# Patient Record
Sex: Female | Born: 1970 | Race: Black or African American | Hispanic: No | Marital: Single | Smoking: Never smoker
Health system: Southern US, Community
[De-identification: ages and names within clinical notes are randomized; demographics above are authoritative.]

## PROBLEM LIST (undated history)

## (undated) DIAGNOSIS — B009 Herpesviral infection, unspecified: Secondary | ICD-10-CM

## (undated) DIAGNOSIS — I10 Essential (primary) hypertension: Secondary | ICD-10-CM

---

## 2006-12-03 ENCOUNTER — Encounter: Admission: RE | Admit: 2006-12-03 | Discharge: 2006-12-03 | Payer: Self-pay | Admitting: Obstetrics and Gynecology

## 2007-03-28 ENCOUNTER — Emergency Department (HOSPITAL_COMMUNITY): Admission: EM | Admit: 2007-03-28 | Discharge: 2007-03-28 | Payer: Self-pay | Admitting: Emergency Medicine

## 2011-07-28 LAB — URINALYSIS, ROUTINE W REFLEX MICROSCOPIC
Glucose, UA: NEGATIVE
Nitrite: NEGATIVE
Specific Gravity, Urine: 1.013

## 2011-07-28 LAB — URINE MICROSCOPIC-ADD ON

## 2011-07-28 LAB — URINE CULTURE

## 2016-02-11 ENCOUNTER — Emergency Department (HOSPITAL_COMMUNITY)
Admission: EM | Admit: 2016-02-11 | Discharge: 2016-02-12 | Disposition: A | Discharge status: Home / Self Care | Payer: BLUE CROSS/BLUE SHIELD | Attending: Emergency Medicine | Admitting: Emergency Medicine

## 2016-02-11 ENCOUNTER — Emergency Department (HOSPITAL_COMMUNITY): Payer: BLUE CROSS/BLUE SHIELD

## 2016-02-11 ENCOUNTER — Encounter (HOSPITAL_COMMUNITY): Payer: Self-pay | Admitting: Emergency Medicine

## 2016-02-11 DIAGNOSIS — R1010 Upper abdominal pain, unspecified: Secondary | ICD-10-CM | POA: Diagnosis present

## 2016-02-11 DIAGNOSIS — Z3202 Encounter for pregnancy test, result negative: Secondary | ICD-10-CM | POA: Diagnosis not present

## 2016-02-11 DIAGNOSIS — I10 Essential (primary) hypertension: Secondary | ICD-10-CM | POA: Insufficient documentation

## 2016-02-11 DIAGNOSIS — R0602 Shortness of breath: Secondary | ICD-10-CM | POA: Insufficient documentation

## 2016-02-11 DIAGNOSIS — N390 Urinary tract infection, site not specified: Secondary | ICD-10-CM

## 2016-02-11 DIAGNOSIS — R05 Cough: Secondary | ICD-10-CM | POA: Diagnosis not present

## 2016-02-11 DIAGNOSIS — R7401 Elevation of levels of liver transaminase levels: Secondary | ICD-10-CM

## 2016-02-11 DIAGNOSIS — R197 Diarrhea, unspecified: Secondary | ICD-10-CM | POA: Diagnosis not present

## 2016-02-11 DIAGNOSIS — R079 Chest pain, unspecified: Secondary | ICD-10-CM | POA: Insufficient documentation

## 2016-02-11 DIAGNOSIS — R740 Nonspecific elevation of levels of transaminase and lactic acid dehydrogenase [LDH]: Secondary | ICD-10-CM | POA: Insufficient documentation

## 2016-02-11 DIAGNOSIS — R1011 Right upper quadrant pain: Secondary | ICD-10-CM

## 2016-02-11 DIAGNOSIS — Z8619 Personal history of other infectious and parasitic diseases: Secondary | ICD-10-CM | POA: Insufficient documentation

## 2016-02-11 DIAGNOSIS — Z79899 Other long term (current) drug therapy: Secondary | ICD-10-CM | POA: Insufficient documentation

## 2016-02-11 HISTORY — DX: Herpesviral infection, unspecified: B00.9

## 2016-02-11 HISTORY — DX: Essential (primary) hypertension: I10

## 2016-02-11 LAB — BASIC METABOLIC PANEL
ANION GAP: 10 (ref 5–15)
BUN: 12 mg/dL (ref 6–20)
CALCIUM: 9.3 mg/dL (ref 8.9–10.3)
CO2: 22 mmol/L (ref 22–32)
Chloride: 106 mmol/L (ref 101–111)
Creatinine, Ser: 0.99 mg/dL (ref 0.44–1.00)
GLUCOSE: 105 mg/dL — AB (ref 65–99)
Potassium: 3.3 mmol/L — ABNORMAL LOW (ref 3.5–5.1)
SODIUM: 138 mmol/L (ref 135–145)

## 2016-02-11 LAB — URINALYSIS, ROUTINE W REFLEX MICROSCOPIC
Bilirubin Urine: NEGATIVE
GLUCOSE, UA: NEGATIVE mg/dL
KETONES UR: NEGATIVE mg/dL
Nitrite: NEGATIVE
PROTEIN: NEGATIVE mg/dL
Specific Gravity, Urine: 1.012 (ref 1.005–1.030)
pH: 5 (ref 5.0–8.0)

## 2016-02-11 LAB — D-DIMER, QUANTITATIVE (NOT AT ARMC): D DIMER QUANT: 0.76 ug{FEU}/mL — AB (ref 0.00–0.50)

## 2016-02-11 LAB — HEPATIC FUNCTION PANEL
ALBUMIN: 4 g/dL (ref 3.5–5.0)
ALT: 84 U/L — ABNORMAL HIGH (ref 14–54)
AST: 178 U/L — ABNORMAL HIGH (ref 15–41)
Alkaline Phosphatase: 88 U/L (ref 38–126)
BILIRUBIN DIRECT: 0.2 mg/dL (ref 0.1–0.5)
BILIRUBIN TOTAL: 1 mg/dL (ref 0.3–1.2)
Indirect Bilirubin: 0.8 mg/dL (ref 0.3–0.9)
Total Protein: 7.3 g/dL (ref 6.5–8.1)

## 2016-02-11 LAB — CBC
HCT: 38.2 % (ref 36.0–46.0)
HEMOGLOBIN: 13 g/dL (ref 12.0–15.0)
MCH: 30.3 pg (ref 26.0–34.0)
MCHC: 34 g/dL (ref 30.0–36.0)
MCV: 89 fL (ref 78.0–100.0)
Platelets: 291 10*3/uL (ref 150–400)
RBC: 4.29 MIL/uL (ref 3.87–5.11)
RDW: 12.8 % (ref 11.5–15.5)
WBC: 11.4 10*3/uL — AB (ref 4.0–10.5)

## 2016-02-11 LAB — I-STAT TROPONIN, ED: TROPONIN I, POC: 0 ng/mL (ref 0.00–0.08)

## 2016-02-11 LAB — URINE MICROSCOPIC-ADD ON

## 2016-02-11 LAB — PREGNANCY, URINE: Preg Test, Ur: NEGATIVE

## 2016-02-11 LAB — LIPASE, BLOOD: LIPASE: 25 U/L (ref 11–51)

## 2016-02-11 MED ORDER — SODIUM CHLORIDE 0.9 % IV BOLUS (SEPSIS)
1000.0000 mL | Freq: Once | INTRAVENOUS | Status: AC
  Administered 2016-02-11: 1000 mL via INTRAVENOUS

## 2016-02-11 MED ORDER — IOPAMIDOL (ISOVUE-370) INJECTION 76%
100.0000 mL | Freq: Once | INTRAVENOUS | Status: AC | PRN
  Administered 2016-02-11: 100 mL via INTRAVENOUS

## 2016-02-11 NOTE — ED Provider Notes (Signed)
CSN: [id number]     Arrival date & time 02/11/16  2016 History   First MD Initiated Contact with Patient 02/11/16 2114     Chief Complaint  Patient presents with  . Chest Pain  . Abdominal Pain    HPI Comments: 45 year old female presents with acute onset of upper abdominal pain radiating to her chest several hours ago. PMH significant for HTN. She states the abdominal pain radiates upward to her chest and through to her back. It is intermittent, sharp and burning. Reports associated body chills, SOB that lasted for 30 min, dry cough, diarrhea, irritative voiding symptoms. She states she is currently asymptomatic. Denies fever, URI symptoms, nausea, vomiting, blood in her stool, vaginal discharge, vaginal bleeding, or vaginal pain. Denies hx of HLD, DM, tobacco use, IVDU. 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     Patient is a 45 y.o. female presenting with chest pain and abdominal pain.  Chest Pain Associated symptoms: abdominal pain, cough and shortness of breath   Associated symptoms: no fever, no nausea, no palpitations and not vomiting   Abdominal Pain Associated symptoms: chest pain, chills, cough, diarrhea, dysuria and shortness of breath   Associated symptoms: no constipation, no fever, no nausea and no vomiting     Past Medical History  Diagnosis Date  . Hypertension   . HSV-1 (herpes simplex virus 1) infection    Past Surgical History  Procedure Laterality Date  . Cesarean section     Family History  Problem Relation Age of Onset  . Heart attack Mother   . Hypertension Other   . Diabetes Other   . Heart attack Other    Social History  Substance Use Topics  . Smoking status: Never Smoker   . Smokeless tobacco: None  . Alcohol Use: Yes     Comment: occ   OB History    No data available     Review of Systems  Constitutional: Positive for chills. Negative for fever.  Respiratory: Positive for cough and shortness of breath. Negative for  wheezing.   Cardiovascular: Positive for chest pain. Negative for palpitations and leg swelling.  Gastrointestinal: Positive for abdominal pain and diarrhea. Negative for nausea, vomiting and constipation.  Genitourinary: Positive for dysuria, urgency and frequency.  All other systems reviewed and are negative.     Allergies  Review of patient's allergies indicates no known allergies.  Home Medications   Prior to Admission medications   Medication Sig Start Date End Date Taking? Authorizing Provider  hydrochlorothiazide (HYDRODIURIL) 25 MG tablet Take 25 mg by mouth daily.  01/15/16  Yes Historical Provider, MD  olmesartan (BENICAR) 20 MG tablet Take 20 mg by mouth daily.   Yes Historical Provider, MD  valACYclovir (VALTREX) 500 MG tablet Take 500 mg by mouth daily as needed (menstrual cycle).  01/15/16  Yes Historical Provider, MD  clindamycin (CLEOCIN) 300 MG capsule take 1 capsule by mouth three times a day for 10 days 02/08/16   Historical Provider, MD   BP 143/90 mmHg  Pulse 105  Temp(Src) 98.1 F (36.7 C) (Oral)  Resp 25  SpO2 99%  LMP 01/17/2016 (Exact Date)   Physical Exam  Constitutional: She is oriented to person, place, and time. She appears well-developed and well-nourished. No distress.  HENT:  Head: Normocephalic and atraumatic.  Eyes: Conjunctivae are normal. Pupils are equal, round, and reactive to light. Right eye exhibits no discharge. Left eye exhibits no discharge. No scleral icterus.  Neck: Normal  range of motion.  Cardiovascular: Normal rate and regular rhythm.  Exam reveals no gallop and no friction rub.   No murmur heard. Pulmonary/Chest: Effort normal and breath sounds normal. No respiratory distress. She has no wheezes. She has no rales. She exhibits no tenderness.  Abdominal: Soft. Bowel sounds are normal. She exhibits no distension and no mass. There is tenderness. There is no rebound and no guarding.  RUQ and epigastric tenderness  Neurological: She is  alert and oriented to person, place, and time.  Skin: Skin is warm and dry.  Psychiatric: She has a normal mood and affect.    ED Course  Procedures (including critical care time) Labs Review Labs Reviewed  BASIC METABOLIC PANEL - Abnormal; Notable for the following:    Potassium 3.3 (*)    Glucose, Bld 105 (*)    All other components within normal limits  CBC - Abnormal; Notable for the following:    WBC 11.4 (*)    All other components within normal limits  URINALYSIS, ROUTINE W REFLEX MICROSCOPIC (NOT AT [facility]) - Abnormal; Notable for the following:    Hgb urine dipstick SMALL (*)    Leukocytes, UA TRACE (*)    All other components within normal limits  HEPATIC FUNCTION PANEL - Abnormal; Notable for the following:    AST 178 (*)    ALT 84 (*)    All other components within normal limits  D-DIMER, QUANTITATIVE (NOT AT [facility]) - Abnormal; Notable for the following:    D-Dimer, Quant 0.76 (*)    All other components within normal limits  URINE MICROSCOPIC-ADD ON - Abnormal; Notable for the following:    Squamous Epithelial / LPF 6-30 (*)    Bacteria, UA RARE (*)    All other components within normal limits  PREGNANCY, URINE  LIPASE, BLOOD  HEPATITIS PANEL, ACUTE  I-STAT TROPOININ, ED    Imaging Review Dg Chest 2 View  02/11/2016  CLINICAL DATA:  Sudden onset of upper abdominal pain radiating to the mid chest this afternoon. Nausea. EXAM: CHEST  2 VIEW COMPARISON:  None. FINDINGS: The lungs are clear. The pulmonary vasculature is normal. Heart size is normal. Hilar and mediastinal contours are unremarkable. There is no pleural effusion. IMPRESSION: No active cardiopulmonary disease. Electronically Signed   By: Ellery Plunk M.D.   On: 02/11/2016 21:29   Ct Angio Chest Pe W/cm &/or Wo Cm  02/11/2016  CLINICAL DATA:  Severe abdominal pain and chills starting about 1830 hours. Patient then developed chest pain radiating to the back. Diarrhea. Shortness of breath. EXAM: CT  ANGIOGRAPHY CHEST WITH CONTRAST TECHNIQUE: Multidetector CT imaging of the chest was performed using the standard protocol during bolus administration of intravenous contrast. Multiplanar CT image reconstructions and MIPs were obtained to evaluate the vascular anatomy. CONTRAST:  100 mL Isovue 370 COMPARISON:  None. FINDINGS: Technically adequate study with good opacification of the central and segmental pulmonary arteries. No focal filling defects are demonstrated. No evidence of significant pulmonary embolus. Normal heart size. Normal caliber thoracic aorta. No aortic dissection. Great vessel origins are patent. No significant lymphadenopathy in the chest. Esophagus is decompressed. Mild dependent changes in the lung bases. No focal airspace disease or consolidation. No pleural effusions. No pneumothorax. Airways appear patent. Few scattered calcified granulomas in the lungs. Included portions of the upper abdominal organs are grossly unremarkable. No destructive bone lesions. Mild degenerative changes in the spine. Review of the MIP images confirms the above findings. IMPRESSION: No evidence of significant  pulmonary embolus. No evidence of active pulmonary disease. Electronically Signed   By: Burman Nieves M.D.   On: 02/11/2016 23:38   US Abdomen Limited Ruq  02/11/2016  CLINICAL DATA:  Right upper quadrant pain. EXAM: US ABDOMEN LIMITED - RIGHT UPPER QUADRANT COMPARISON:  None. FINDINGS: Gallbladder: The gallbladder is contracted. This could be a pathologic contraction if the patient was fasting. No conclusive calculi. Probable 3 mm polyp. Normal Pauwels thickness. No pericholecystic fluid. Common bile duct: Diameter: Normal, 2.7 mm. Liver: No focal lesion identified. Within normal limits in parenchymal echogenicity. IMPRESSION: Contracted gallbladder. Probable 3 mm polyp. No conclusive calculi. No bile duct dilatation. Normal liver. Electronically Signed   By: Ellery Plunk M.D.   On: 02/11/2016 23:02    I have personally reviewed and evaluated these images and lab results as part of my medical decision-making.   EKG Interpretation   Date/Time:  Wednesday Feb 11 2016 20:29:02 EDT Ventricular Rate:  116 PR Interval:  117 QRS Duration: 88 QT Interval:  330 QTC Calculation: 458 R Axis:   46 Text Interpretation:  Sinus tachycardia Probable left atrial enlargement  No previous ECGs available Confirmed by YAO  MD, DAVID ([id number]) on 02/11/2016  10:04:41 PM      MDM   Final diagnoses:  RUQ pain  Elevated transaminase level  UTI (lower urinary tract infection)   45 year old female who presents with acute onset of abdominal pain, chest pain, and multiple other symptoms which have resolved since being in the ER.  She was initially tachycardic in to the 130s in triage but once roomed, her HR slowed to upper 90s. All other vitals were stable and she is afebrile. PE revealed tender RUQ and epigastric area. RUQ US obtained which showed contracted gallbladder however no evidence of cholelithiasis/cholecystitis, bile duct dilatation, and unremarkable liver. CMP was remarkable for AST/ALT of 178/84. No previous results are available for comparison however patient states that she has never been told she has elevated liver enzymes. CBC is remarkable for mildly elevated leukocytosis (11.4). UA showed small amount of Hgb, trace leukocytes, with rare bacteria. Patient is complaining of some dysuria so will treat for UTI with Keflex. Pregnancy neg, Lipase normal. Troponin is 0 with EKG showing sinus tachycardia. CXR was clear. D-dimer obtained due to tachycardia and SOB which was elevated at .76. CTA of chest was neg for PE.   Discussed all results with patient. Hepatitis panel sent off and patient informed she would not be able to get these results tonight. Patient is NAD, non-toxic, with improved/stable VS. Patient is informed of clinical course, understands medical decision making process, and agrees with  plan. Opportunity for questions provided and all questions answered. Return precautions given.      Bethel Born, PA-C 02/12/16 1401  Richardean Canal, MD 02/12/16 2012

## 2016-02-11 NOTE — ED Notes (Signed)
Pt states about 1830 she started having severe abd pain and chills  Pt states then she developed some chest pain that radiates into her back  Pt states she has had 2 episodes of diarrhea and feels shaky and has numbness in her fingertips  Pt states she has had some shortness of breath as well

## 2016-02-12 MED ORDER — CEPHALEXIN 500 MG PO CAPS: 500.0000 mg | ORAL_CAPSULE | Freq: Three times a day (TID) | ORAL | Status: DC

## 2016-02-12 NOTE — Discharge Instructions (Signed)
Today you were evaluated for your symptoms of abdominal pain, chest pain, body chills, and shortness of breath. Your blood levels were suggestive of possible hepatitis due to elevated liver enzymes. We sent a hepatitis panel out which will not come back for several days. Someone should call you with your results. If they have not, please contact the hospital or go on to MyChart to get your results. Your urine test also showed a possible UTI. Please take the antibiotic for the full course of 10 days. If you experience a worsening of your symptoms, please return to the Emergency Dept.

## 2016-02-13 LAB — HEPATITIS PANEL, ACUTE
HCV Ab: <0.1 s/co ratio (ref 0.0–0.9)
HEP B C IGM: NEGATIVE
HEP B S AG: NEGATIVE
Hep A IgM: NEGATIVE

## 2020-02-19 ENCOUNTER — Encounter (HOSPITAL_COMMUNITY): Payer: Self-pay | Admitting: Emergency Medicine

## 2020-02-19 ENCOUNTER — Other Ambulatory Visit: Payer: Self-pay

## 2020-02-19 ENCOUNTER — Emergency Department (HOSPITAL_COMMUNITY)
Admission: EM | Admit: 2020-02-19 | Discharge: 2020-02-19 | Disposition: A | Discharge status: Home / Self Care | Payer: BC Managed Care – PPO | Attending: Emergency Medicine | Admitting: Emergency Medicine

## 2020-02-19 ENCOUNTER — Emergency Department (HOSPITAL_COMMUNITY): Payer: BC Managed Care – PPO

## 2020-02-19 DIAGNOSIS — I10 Essential (primary) hypertension: Secondary | ICD-10-CM | POA: Insufficient documentation

## 2020-02-19 DIAGNOSIS — R569 Unspecified convulsions: Secondary | ICD-10-CM | POA: Insufficient documentation

## 2020-02-19 DIAGNOSIS — Z79899 Other long term (current) drug therapy: Secondary | ICD-10-CM | POA: Diagnosis not present

## 2020-02-19 DIAGNOSIS — R531 Weakness: Secondary | ICD-10-CM | POA: Diagnosis present

## 2020-02-19 LAB — DIFFERENTIAL
Abs Immature Granulocytes: 0.01 10*3/uL (ref 0.00–0.07)
Basophils Absolute: 0.1 10*3/uL (ref 0.0–0.1)
Basophils Relative: 1 %
Eosinophils Absolute: 0.2 10*3/uL (ref 0.0–0.5)
Eosinophils Relative: 4 %
Immature Granulocytes: 0 %
Lymphocytes Relative: 41 %
Lymphs Abs: 2.4 10*3/uL (ref 0.7–4.0)
Monocytes Absolute: 0.4 10*3/uL (ref 0.1–1.0)
Monocytes Relative: 7 %
Neutro Abs: 2.8 10*3/uL (ref 1.7–7.7)
Neutrophils Relative %: 47 %

## 2020-02-19 LAB — APTT: aPTT: 28 seconds (ref 24–36)

## 2020-02-19 LAB — COMPREHENSIVE METABOLIC PANEL
ALT: 11 U/L (ref 0–44)
AST: 14 U/L — ABNORMAL LOW (ref 15–41)
Albumin: 3.7 g/dL (ref 3.5–5.0)
Alkaline Phosphatase: 55 U/L (ref 38–126)
Anion gap: 11 (ref 5–15)
BUN: 10 mg/dL (ref 6–20)
CO2: 25 mmol/L (ref 22–32)
Calcium: 9.2 mg/dL (ref 8.9–10.3)
Chloride: 106 mmol/L (ref 98–111)
Creatinine, Ser: 0.93 mg/dL (ref 0.44–1.00)
GFR calc Af Amer: >60 mL/min (ref >60)
GFR calc non Af Amer: >60 mL/min (ref >60)
Glucose, Bld: 110 mg/dL — ABNORMAL HIGH (ref 70–99)
Potassium: 3.2 mmol/L — ABNORMAL LOW (ref 3.5–5.1)
Sodium: 142 mmol/L (ref 135–145)
Total Bilirubin: 0.7 mg/dL (ref 0.3–1.2)
Total Protein: 6.7 g/dL (ref 6.5–8.1)

## 2020-02-19 LAB — CBC
HCT: 40.6 % (ref 36.0–46.0)
Hemoglobin: 13.6 g/dL (ref 12.0–15.0)
MCH: 30.4 pg (ref 26.0–34.0)
MCHC: 33.5 g/dL (ref 30.0–36.0)
MCV: 90.8 fL (ref 80.0–100.0)
Platelets: 339 10*3/uL (ref 150–400)
RBC: 4.47 MIL/uL (ref 3.87–5.11)
RDW: 12 % (ref 11.5–15.5)
WBC: 5.8 10*3/uL (ref 4.0–10.5)
nRBC: 0 % (ref 0.0–0.2)

## 2020-02-19 LAB — I-STAT BETA HCG BLOOD, ED (MC, WL, AP ONLY): I-stat hCG, quantitative: <5 m[IU]/mL (ref <5)

## 2020-02-19 LAB — CBG MONITORING, ED: Glucose-Capillary: 98 mg/dL (ref 70–99)

## 2020-02-19 LAB — PROTIME-INR
INR: 1.1 (ref 0.8–1.2)
Prothrombin Time: 13.3 seconds (ref 11.4–15.2)

## 2020-02-19 MED ORDER — GADOBUTROL 1 MMOL/ML IV SOLN
7.4000 mL | Freq: Once | INTRAVENOUS | Status: AC | PRN
  Administered 2020-02-19: 7.4 mL via INTRAVENOUS

## 2020-02-19 MED ORDER — GABAPENTIN 300 MG PO CAPS: 300.0000 mg | ORAL_CAPSULE | Freq: Three times a day (TID) | ORAL | 0 refills | Status: DC

## 2020-02-19 MED ORDER — POTASSIUM CHLORIDE CRYS ER 20 MEQ PO TBCR
40.0000 meq | EXTENDED_RELEASE_TABLET | Freq: Once | ORAL | Status: AC
  Administered 2020-02-19: 40 meq via ORAL
  Filled 2020-02-19: qty 4

## 2020-02-19 MED ORDER — GABAPENTIN 100 MG PO CAPS
300.0000 mg | ORAL_CAPSULE | Freq: Once | ORAL | Status: AC
  Administered 2020-02-19: 300 mg via ORAL
  Filled 2020-02-19: qty 3

## 2020-02-19 NOTE — Consult Note (Addendum)
Neurology Consultation  Reason for Consult: Patient with periods of extension of arm and leg which to patient feels as though is locking up Referring Physician: Criss Alvine  CC: Locking up of right arm and leg  History is obtained from: Patient  HPI: Helen Lee is a 49 y.o. female history of hypertension along with HSV 1, and premature birth.  Patient presented to the emergency department secondary to having multiple episodes where her right arm and right leg would stiffen up for approximately 8 minutes.  Patient states she has no control over this and she is not quite clear when it will happen.  Neurology was consulted for possibility of seizure.  Patient states that her first episode was approximately 2 weeks ago.  At that time she noted that her arm locked up and she was unable to lift it.  When further asked she states that it became stiff.  This morning patient states that she was taking the mouse from her computer and placing in her back when suddenly her hand clenched up tightly and then it marched up her arm and her whole arm became stiff for about 8 minutes and then relax.  Husband was present and also noticed the right aspect of her face was pulling up.  Due to this she had explained to her husband she think she needs go to the hospital at no time in point did she lose consciousness, and was able to explain everything that was going on to her husband.  While she was waiting in the waiting room she had another event which she noted was coming on as she felt her right hand started to tighten up.  Again this lasted for about 3 to 5 minutes and then relax.  At this time again, she did not lose any consciousness was alert and oriented during the whole episode.  As noted both of these events started in the hand moved up her arm and then down her leg.  Patient also states she had no headache during these events.  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  She has had no head  trauma or concussions in the past.  ED course  Relevant labs include -potassium 3.2, glucose 110, AST 14.  CT head shows-patchy T2 hyperintensity of the periventricular white matter within the associated squared appearance of the atrium of the lateral ventricles.  A few other foci of T2 hyperintensity are seen within the left corona radiata and centrum semiovale.   Past Medical History:  Diagnosis Date  . HSV-1 (herpes simplex virus 1) infection   . Hypertension      Family History  Problem Relation Age of Onset  . Heart attack Mother   . Hypertension Other   . Diabetes Other   . Heart attack Other    Social History:   reports that she has never smoked. She does not have any smokeless tobacco history on file. She reports current alcohol use. She reports that she does not use drugs.  Medications No current facility-administered medications for this encounter.  Current Outpatient Medications:  .  amLODipine (NORVASC) 10 MG tablet, Take 10 mg by mouth daily., Disp: , Rfl:  .  ergocalciferol (VITAMIN D2) 1.25 MG (50000 UT) capsule, Take 50,000 Units by mouth once a week., Disp: , Rfl:  .  escitalopram (LEXAPRO) 10 MG tablet, Take 10 mg by mouth daily., Disp: , Rfl:  .  hydrochlorothiazide (HYDRODIURIL) 25 MG tablet, Take 25 mg by mouth daily. ,  Disp: , Rfl: 0 .  medroxyPROGESTERone Acetate 150 MG/ML SUSY, Inject 1 mL into the muscle every 3 (three) months., Disp: , Rfl:   ROS:     General ROS: negative for - chills, fatigue, fever, night sweats, weight gain or weight loss Psychological ROS: negative for - behavioral disorder, hallucinations, memory difficulties, mood swings or suicidal ideation Ophthalmic ROS: negative for - blurry vision, double vision, eye pain or loss of vision ENT ROS: negative for - epistaxis, nasal discharge, oral lesions, sore throat, tinnitus or vertigo Allergy and Immunology ROS: negative for - hives or itchy/watery eyes Hematological and Lymphatic ROS:  negative for - bleeding problems, bruising or swollen lymph nodes Endocrine ROS: negative for - galactorrhea, hair pattern changes, polydipsia/polyuria or temperature intolerance Respiratory ROS: negative for - cough, hemoptysis, shortness of breath or wheezing Cardiovascular ROS: negative for - chest pain, dyspnea on exertion, edema or irregular heartbeat Gastrointestinal ROS: negative for - abdominal pain, diarrhea, hematemesis, nausea/vomiting or stool incontinence Genito-Urinary ROS: negative for - dysuria, hematuria, incontinence or urinary frequency/urgency Musculoskeletal ROS: negative for - joint swelling or muscular weakness Neurological ROS: as noted in HPI Dermatological ROS: negative for rash and skin lesion changes  Exam: Current vital signs: BP (!) 138/94   Pulse 87   Temp 99 F (37.2 C)   Resp 17   Ht 5\' 1"  (1.549 m)   Wt 74.8 kg   SpO2 99%   BMI 31.18 kg/m  Vital signs in last 24 hours: Temp:  [98.8 F (37.1 C)-99 F (37.2 C)] 99 F (37.2 C) (05/11 1005) Pulse Rate:  [80-111] 87 (05/11 1030) Resp:  [12-18] 17 (05/11 1030) BP: (138-161)/(94-138) 138/94 (05/11 1030) SpO2:  [99 %-100 %] 99 % (05/11 1030) Weight:  [74.8 kg] 74.8 kg (05/11 0740)   Constitutional: Appears well-developed and well-nourished.  Psych: Affect appropriate to situation Eyes: No scleral injection HENT: No OP obstrucion Head: Normocephalic.  Cardiovascular: Normal rate and regular rhythm.  Respiratory: Effort normal, non-labored breathing GI: Soft.  No distension. There is no tenderness.  Skin: WDI  Neuro: Mental Status: Patient is awake, alert, oriented to person, place, month, year, and situation.  Able to follow three-step commands.  Speech is clear with no dysarthria or aphasia.  Patient is able to name, repeat, comprehend.  Patient is able to follow three-step commands.  Patient is able to give a good history.  Cranial Nerves: II: Visual Fields are full.  III,IV, VI: EOMI  without ptosis or diploplia. Pupils equal, round and reactive to light V: Facial sensation is symmetric to temperature VII: Facial movement is symmetric.  VIII: hearing is intact to voice X: Palat elevates symmetrically XI: Shoulder shrug is symmetric. XII: tongue is midline without atrophy or fasciculations.  Motor: Tone is normal. Bulk is normal. 5/5 strength was present in all four extremities.  Sensory: Sensation is symmetric to light touch and temperature in the arms and legs. DSS intact Deep Tendon Reflexes: 2+ and symmetric in the biceps and patellae.  Plantars: Toes are downgoing bilaterally.  Cerebellar: FNF and HKS are intact bilaterally  Labs I have reviewed labs in epic and the results pertinent to this consultation are:   CBC    Component Value Date/Time   WBC 5.8 02/19/2020 0749   RBC 4.47 02/19/2020 0749   HGB 13.6 02/19/2020 0749   HCT 40.6 02/19/2020 0749   PLT 339 02/19/2020 0749   MCV 90.8 02/19/2020 0749   MCH 30.4 02/19/2020 0749   MCHC 33.5 02/19/2020  0749   RDW 12.0 02/19/2020 0749   LYMPHSABS 2.4 02/19/2020 0749   MONOABS 0.4 02/19/2020 0749   EOSABS 0.2 02/19/2020 0749   BASOSABS 0.1 02/19/2020 0749    CMP     Component Value Date/Time   NA 142 02/19/2020 0749   K 3.2 (L) 02/19/2020 0749   CL 106 02/19/2020 0749   CO2 25 02/19/2020 0749   GLUCOSE 110 (H) 02/19/2020 0749   BUN 10 02/19/2020 0749   CREATININE 0.93 02/19/2020 0749   CALCIUM 9.2 02/19/2020 0749   PROT 6.7 02/19/2020 0749   ALBUMIN 3.7 02/19/2020 0749   AST 14 (L) 02/19/2020 0749   ALT 11 02/19/2020 0749   ALKPHOS 55 02/19/2020 0749   BILITOT 0.7 02/19/2020 0749   GFRNONAA >60 02/19/2020 0749   GFRAA >60 02/19/2020 0749    Lipid Panel  No results found for: CHOL, TRIG, HDL, CHOLHDL, VLDL, LDLCALC, LDLDIRECT   Imaging I have reviewed the images obtained:  MRI examination of the brain-Patchy T2 hyperintensity of the periventricular white matter within the  associated squared appearance of the atrium of the lateral ventricles.  A few other foci of T2 hyperintensity are seen within the left corona radiata and centrum semiovale.  Felicie Morn PA-C [facility] [phone]  M-F  (9:00 am- 5:00 PM)  02/19/2020, 1:23 PM   I have seen the patient and reviewed the above note.  Though she had described sensation as "numbness," on further pressing, it sounds like it was more simply that she did not have full control over it, but never had any actual sensory changes.  Assessment:  This is a 49 year old female with a history of premature birth however no past history of seizures, head trauma or concussion.  Patient now has experienced 3 episodes to which she had dystonic activity of her arm and leg lasting approximately 5 to 8 minutes.    Possible etiologies include paroxysmal dystonia versus partial motor seizures.  Given that she has had multiple episodes, I would favor starting medication, gabapentin might be useful either way therefore I would start with this.  Impression: -Simple partial motor seizures.  Recommendations: -Gabapentin 300 mg 3 times daily -Likely would benefit from an EEG as an outpatient. -Patient will need to follow-up with neurology as an outpatient -I advised patient not to drive until cleared by an outpatient neurologist.  Ritta Slot, MD [facility] [phone]  If 7pm- 7am, please page neurology on call as listed in AMION.

## 2020-02-19 NOTE — ED Triage Notes (Signed)
Pt arrives from home after having a episode at 0700 where her whole right side went numbness and "felt locked up". By he time EMS arrived pt was back to baseline. Pt states this is the second time this has happened where her right side goes numb and locks up and then resolves.

## 2020-02-19 NOTE — ED Provider Notes (Signed)
[facility] EMERGENCY DEPARTMENT Provider Note   CSN: [id number] Arrival date & time: 02/19/20  0735     History Chief Complaint  Patient presents with  . Weakness    Helen Lee is a 49 y.o. female.  HPI 49 year old female presents with right arm and leg weakness.  Started a couple weeks ago where she transiently had right arm numbness and inability to move it and then later in the night right leg numbness and inability to move it.  She works at [employer] and had a head CT and carotid ultrasound that she reports were okay.  Has not occurred until today.  This morning she had a 15-minute episode of her right arm and right leg being "locked up".  She was unable to move them but they also seem to be locked into place.  She could not let go of the mall she was holding.  No numbness this time.  No headaches or vision changes.  Happened again while in the waiting room for only a few minutes and she had a little bit of chest burning at that time. Chest discomfort is gone now. Currently all symptoms are gone.  Has hypertension and states her A1c was a little high.   Past Medical History:  Diagnosis Date  . HSV-1 (herpes simplex virus 1) infection   . Hypertension   . Premature birth     There are no problems to display for this patient.   Past Surgical History:  Procedure Laterality Date  . CESAREAN SECTION       OB History   No obstetric history on file.     Family History  Problem Relation Age of Onset  . Heart attack Mother   . Hypertension Other   . Diabetes Other   . Heart attack Other     Social History   Tobacco Use  . Smoking status: Never Smoker  Substance Use Topics  . Alcohol use: Yes    Comment: occ  . Drug use: No    Home Medications Prior to Admission medications   Medication Sig Start Date End Date Taking? Authorizing Provider  amLODipine (NORVASC) 10 MG tablet Take 10 mg by mouth daily. 01/28/20  Yes [provider]  ergocalciferol (VITAMIN D2) 1.25 MG (50000 UT) capsule Take 50,000 Units by mouth once a week.   Yes [provider]  escitalopram (LEXAPRO) 10 MG tablet Take 10 mg by mouth daily. 01/29/20  Yes [provider]  hydrochlorothiazide (HYDRODIURIL) 25 MG tablet Take 25 mg by mouth daily.  01/15/16  Yes [provider]  medroxyPROGESTERone Acetate 150 MG/ML SUSY Inject 1 mL into the muscle every 3 (three) months. 01/28/20  Yes [provider]  gabapentin (NEURONTIN) 300 MG capsule Take 1 capsule (300 mg total) by mouth 3 (three) times daily. 02/19/20   Pricilla Loveless, MD    Allergies    Patient has no known allergies.  Review of Systems   Review of Systems  Eyes: Negative for visual disturbance.  Gastrointestinal: Negative for vomiting.  Neurological: Positive for numbness. Negative for headaches.  All other systems reviewed and are negative.   Physical Exam Updated Vital Signs BP 139/90   Pulse 90   Temp 98.4 F (36.9 C) (Oral)   Resp 14   Ht 5\' 1"  (1.549 m)   Wt 74.8 kg   SpO2 100%   BMI 31.18 kg/m   Physical Exam Vitals and nursing note reviewed.  Constitutional:  General: She is not in acute distress.    Appearance: She is well-developed. She is not ill-appearing or diaphoretic.  HENT:     Head: Normocephalic and atraumatic.     Right Ear: External ear normal.     Left Ear: External ear normal.     Nose: Nose normal.  Eyes:     General:        Right eye: No discharge.        Left eye: No discharge.  Cardiovascular:     Rate and Rhythm: Normal rate and regular rhythm.     Heart sounds: Normal heart sounds.  Pulmonary:     Effort: Pulmonary effort is normal.     Breath sounds: Normal breath sounds.  Abdominal:     Palpations: Abdomen is soft.     Tenderness: There is no abdominal tenderness.  Skin:    General: Skin is warm and dry.  Neurological:     Mental Status: She is alert.     Comments: CN 3-12 grossly  intact. 5/5 strength in all 4 extremities. Grossly normal sensation. Normal finger to nose.   Psychiatric:        Mood and Affect: Mood is anxious.     ED Results / Procedures / Treatments   Labs (all labs ordered are listed, but only abnormal results are displayed) Labs Reviewed  COMPREHENSIVE METABOLIC PANEL - Abnormal; Notable for the following components:      Result Value   Potassium 3.2 (*)    Glucose, Bld 110 (*)    AST 14 (*)    All other components within normal limits  PROTIME-INR  APTT  CBC  DIFFERENTIAL  CBG MONITORING, ED  I-STAT BETA HCG BLOOD, ED ([facility], [facility], [facility] ONLY)    EKG EKG Interpretation  Date/Time:  Tuesday Feb 19 2020 07:49:54 EDT Ventricular Rate:  83 PR Interval:  128 QRS Duration: 80 QT Interval:  372 QTC Calculation: 437 R Axis:   50 Text Interpretation: Normal sinus rhythm with sinus arrhythmia no acute ST/T changes rate is slower, otherwise similar to 2017 Confirmed by Pricilla Loveless [phone]) on 02/19/2020 10:10:36 AM   Radiology MR Brain W and Wo Contrast  Result Date: 02/19/2020 CLINICAL DATA:  TIA. Right-sided numbness. EXAM: MRI HEAD WITHOUT AND WITH CONTRAST TECHNIQUE: Multiplanar, multiecho pulse sequences of the brain and surrounding structures were obtained without and with intravenous contrast. CONTRAST:  7.83mL GADAVIST GADOBUTROL 1 MMOL/ML IV SOLN COMPARISON:  None. FINDINGS: Brain: No acute infarction, hemorrhage, hydrocephalus, extra-axial collection or mass lesion. Patchy T2 hyperintensity of the periventricular white matter with associated squared appearance of the atrium of the lateral ventricles. A few other foci of T2 hyperintensity are seen within the left corona radiata and centrum semiovale, nonspecific. Vascular: Normal flow voids. Skull and upper cervical spine: Normal marrow signal. Sinuses/Orbits: Mucosal thickening of the right maxillary sinus. The orbits are maintained. Other: None. IMPRESSION: 1. No acute intracranial  abnormality. 2. Patchy T2 hyperintensity of the white matter, predominantly periventricular, with associated squared appearance of the atrium of the lateral ventricles. 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 These results were called by telephone at the time of interpretation on 02/19/2020 at 12:34 pm to provider Pricilla Loveless , who verbally acknowledged these results. Electronically Signed   By: Baldemar Lenis M.D.   On: 02/19/2020 12:37    Procedures Procedures (including critical care time)  Medications Ordered in ED Medications  potassium chloride SA (KLOR-CON) CR tablet 40  mEq (40 mEq Oral Given 02/19/20 1035)  gadobutrol (GADAVIST) 1 MMOL/ML injection 7.4 mL (7.4 mLs Intravenous Contrast Given 02/19/20 1146)  gabapentin (NEURONTIN) capsule 300 mg (300 mg Oral Given 02/19/20 1444)    ED Course  I have reviewed the triage vital signs and the nursing notes.  Pertinent labs & imaging results that were available during my care of the patient were reviewed by me and considered in my medical decision making (see chart for details).  Clinical Course as of Feb 18 1445  Tue Feb 19, 2020  1006 I discussed with Dr. Leonel Ramsay of neuro. Will get MRI w and w/o and re-eval from there.   [SG]    Clinical Course User Index [SG] Sherwood Gambler, MD   MDM Rules/Calculators/A&P                      MRI shows evidence of previous premature birth but no acute emergent findings.  Neurology has seen and thinks this could be focal seizures.  Recommends gabapentin 300 mg 3 times daily.  Follow-up with outpatient neurology.  She has been instructed not to drive.  Otherwise, some mild hypokalemia which was repleted.  Labs are otherwise reassuring. 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

## 2020-02-19 NOTE — Discharge Instructions (Addendum)
-   According to [place] law, you can not drive unless you are seizure / syncope free for at least 6 months and under physician's care.  °  °- Please maintain precautions. Do not participate in activities where a loss of awareness could harm you or someone else. No swimming alone, no tub bathing, no hot tubs, no driving, no operating motorized vehicles (cars, ATVs, motocycles, etc), lawnmowers, power tools or firearms. No standing at heights, such as rooftops, ladders or stairs. Avoid hot objects such as stoves, heaters, open fires. Wear a helmet when riding a bicycle, scooter, skateboard, etc. and avoid areas of traffic. Set your water heater to 120 degrees or less.  °

## 2020-02-25 ENCOUNTER — Other Ambulatory Visit: Payer: Self-pay

## 2020-02-25 ENCOUNTER — Ambulatory Visit (INDEPENDENT_AMBULATORY_CARE_PROVIDER_SITE_OTHER): Payer: BC Managed Care – PPO | Admitting: Neurology

## 2020-02-25 ENCOUNTER — Encounter: Payer: Self-pay | Admitting: Neurology

## 2020-02-25 VITALS — BP 146/92 | HR 105 | Resp 18 | Ht 60.0 in | Wt 168.0 lb

## 2020-02-25 DIAGNOSIS — R569 Unspecified convulsions: Secondary | ICD-10-CM

## 2020-02-25 MED ORDER — OXCARBAZEPINE 300 MG PO TABS: ORAL_TABLET | ORAL | 6 refills | Status: DC

## 2020-02-25 NOTE — Progress Notes (Signed)
NEUROLOGY CONSULTATION NOTE  Helen Lee MRN: [id number] DOB: Nov 17, 1970  Referring provider: Ellender Hose, PA-C Primary care provider: Ellender Hose, PA-C  Reason for consult:  Stroke-like symptoms   Thank you for your kind referral of Helen Lee for consultation of the above symptoms. Although her history is well known to you, please allow me to reiterate it for the purpose of our medical record. The patient was accompanied to the clinic by her aunt who also provides collateral information. Records and images were personally reviewed where available.  HISTORY OF PRESENT ILLNESS: This is a pleasant 49 year old right-handed woman with a history of hypertension, anxiety, presenting for evaluation of recurrent episodes of right-sided stiffening. The first episode occurred 01/27/20 while she was at home, she noticed she could not lift her right arm, it was "just there." Symptoms lasted a few minutes then she was back to baseline. Around 5 hours later, she was walking to the bedroom when her right leg "was just stuck," she could not move it, she did not fall. The episode lasted a few minutes then she was again back to baseline. There was no associated headache, dizziness, paresthesias. Face was unaffected. She had a head CT without contrast on 4/20 which was unremarkable. Carotid dopplers did not show any significant stenosis. She had another cluster of right-sided symptoms on 02/19/20. She was getting ready for work early in the morning when she went to pick up her computer mouse and her right hand locked around the mouse. She was unable to move her hand, then symptoms started traveling down her right leg, extending out with her toes curled under. She felt like she was going to fall but did not. The episode lasted 5 minutes, her husband told EMS that the right side of her face twitched up a little as well. She was brought to [facility] [place] and had another brief episode in the waiting room, she felt this one coming,  there was a sensation in her right hand, then her right leg/foot locked up with leg extended for a few minutes. 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 She was discharged home on gabapentin 300mg  TID, at home that evening, she had another episode at 1am that woke her up from sleep. None since then. She is having side effects of drowsiness, dizziness, imbalance on the gabapentin. She denies any staring/unresponsive episodes, gaps in time, olfactory/gustatory hallucinations, deja vu, rising epigastric sensation, focal numbness/tingling/weakness, myoclonic jerks. She has had bilateral hand numbness for a year. She has noticed her memory has been off the past 3 weeks, she is not sharp as she used to be. She had a recently and felt so embarrassed because she knew what she was going to say but could not get her words out for a few minutes.  She has had pain below her left scapula for the past couple of weeks, with pain radiating under her left breast. She cannot lay on her left side, feeling a sharp pain radiating "straight to the heart." She denies any heaving lifting or falls, she mostly carries her 10-lb work bag with her right arm. She denies any neck or low back pain. She denies any headaches, diplopia, dysarthria/dysphagia, bowel/bladder dysfunction.   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  There is no history of febrile convulsions, CNS infections such as meningitis/encephalitis, significant traumatic brain injury, neurosurgical procedures. Her sister has alcohol withdrawal seizures.   Laboratory Data: Lab Results  Component Value Date   WBC  5.8 02/19/2020   HGB 13.6 02/19/2020   HCT 40.6 02/19/2020   MCV 90.8 02/19/2020   PLT 339 02/19/2020     Chemistry      Component Value Date/Time   NA 142 02/19/2020 0749   K 3.2 (L) 02/19/2020 0749   CL 106 02/19/2020 0749   CO2 25 02/19/2020 0749   BUN 10 02/19/2020 0749   CREATININE 0.93 02/19/2020 0749      Component Value Date/Time   CALCIUM 9.2 02/19/2020 0749   ALKPHOS 55 02/19/2020 0749   AST 14 (L) 02/19/2020 0749   ALT 11 02/19/2020 0749   BILITOT 0.7 02/19/2020 0749       PAST MEDICAL HISTORY: Past Medical History:  Diagnosis Date  . HSV-1 (herpes simplex virus 1) infection   . Hypertension   . Premature birth     PAST SURGICAL HISTORY: Past Surgical History:  Procedure Laterality Date  . CESAREAN SECTION      MEDICATIONS: Current Outpatient Medications on File Prior to Visit  Medication Sig Dispense Refill  . amLODipine (NORVASC) 10 MG tablet Take 10 mg by mouth daily.    . ergocalciferol (VITAMIN D2) 1.25 MG (50000 UT) capsule Take 50,000 Units by mouth once a week.    . escitalopram (LEXAPRO) 10 MG tablet Take 10 mg by mouth daily.    Marland Kitchen gabapentin (NEURONTIN) 300 MG capsule Take 1 capsule (300 mg total) by mouth 3 (three) times daily. 90 capsule 0  . hydrochlorothiazide (HYDRODIURIL) 25 MG tablet Take 25 mg by mouth daily.   0  . medroxyPROGESTERone Acetate 150 MG/ML SUSY Inject 1 mL into the muscle every 3 (three) months.     No current facility-administered medications on file prior to visit.    ALLERGIES: No Known Allergies  FAMILY HISTORY: Family History  Problem Relation Age of Onset  . Heart attack Mother   . Hypertension Other   . Diabetes Other   . Heart attack Other     SOCIAL HISTORY: Social History   Socioeconomic History  . Marital status: Single    Spouse name: Not on file  . Number of children: Not on file  . Years of education: Not on file  . Highest education level: Not on file  Occupational History  . Not on  file  Tobacco Use  . Smoking status: Never Smoker  . Smokeless tobacco: Never Used  Substance and Sexual Activity  . Alcohol use: Yes    Comment: occ  . Drug use: No  . Sexual activity: Not on file  Other Topics Concern  . Not on file  Social History Narrative  . Not on file   Social Determinants of Health   Financial Resource Strain:   . Difficulty of Paying Living Expenses:   Food Insecurity:   . Worried About Programme researcher, broadcasting/film/video in the Last Year:   . Barista in the Last Year:   Transportation Needs:   . Freight forwarder (Medical):   Marland Kitchen Lack of Transportation (Non-Medical):   Physical Activity:   . Days of Exercise per Week:   . Minutes of Exercise per Session:   Stress:   .  Feeling of Stress :   Social Connections:   . Frequency of Communication with Friends and Family:   . Frequency of Social Gatherings with Friends and Family:   . Attends Religious Services:   . Active Member of Clubs or Organizations:   . Attends Archivist Meetings:   Marland Kitchen Marital Status:   Intimate Partner Violence:   . Fear of Current or Ex-Partner:   . Emotionally Abused:   Marland Kitchen Physically Abused:   . Sexually Abused:     REVIEW OF SYSTEMS: Constitutional: No fevers, chills, or sweats, no generalized fatigue, change in appetite Eyes: No visual changes, double vision, eye pain Ear, nose and throat: No hearing loss, ear pain, nasal congestion, sore throat Cardiovascular: No chest pain, palpitations Respiratory:  No shortness of breath at rest or with exertion, wheezes GastrointestinaI: No nausea, vomiting, diarrhea, abdominal pain, fecal incontinence Genitourinary:  No dysuria, urinary retention or frequency Musculoskeletal:  No neck pain, back pain, +left scapular pain Integumentary: No rash, pruritus, skin lesions Neurological: as above Psychiatric: No depression, insomnia, anxiety Endocrine: No palpitations, fatigue, diaphoresis, mood swings, change in appetite,  change in weight, increased thirst Hematologic/Lymphatic:  No anemia, purpura, petechiae. Allergic/Immunologic: no itchy/runny eyes, nasal congestion, recent allergic reactions, rashes  PHYSICAL EXAM: Vitals:   02/25/20 1245  BP: (!) 146/92  Pulse: (!) 105  Resp: 18  SpO2: 98%   General: No acute distress Head:  Normocephalic/atraumatic Skin/Extremities: No rash, no edema Neurological Exam: Mental status: alert and oriented to person, place, and time, no dysarthria or aphasia, Fund of knowledge is appropriate.  Recent and remote memory are intact.  Attention and concentration are normal.    Cranial nerves: CN I: not tested CN II: pupils equal, round and reactive to light, visual fields intact CN III, IV, VI:  full range of motion, no nystagmus, no ptosis CN V: facial sensation intact CN VII: upper and lower face symmetric CN VIII: hearing intact to conversation Bulk & Tone: normal, no fasciculations. Motor: 5/5 throughout with no pronator drift. Sensation: intact to light touch, cold, pin, vibration and joint position sense.  No extinction to double simultaneous stimulation.  Romberg test negative Deep Tendon Reflexes: +2 throughout, no ankle clonus Plantar responses: downgoing bilaterally Cerebellar: no incoordination on finger to nose testing Gait: narrow-based and steady, able to tandem walk adequately. 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 MRI brain no acute changes. A 1-hour EEG will be ordered. She is feeling drowsy on the gabapentin, she will start oxcarbazepine, side effects discussed. Start oxcarbazepine 150mg  BID, then increase every 3 days up to 600mg  BID. She will start weaning off the gabapentin. She has no pregnancy plans. [place] driving laws were discussed with the patient, and she knows to stop  driving after an episode of loss of consciousness/awareness, until 6 months seizure-free. Follow-up in 3 months, she knows to call for any changes.   Thank you for allowing me to participate in the care of this patient. Please do not hesitate to call for any questions or concerns.   Ellouise Newer, M.D.  CC: [facility]

## 2020-02-25 NOTE — Patient Instructions (Signed)
1. Schedule 1-hour EEG  2. Start oxcarbazepine (Trileptal) 300mg : Take 1/2 tab twice a day for 3 days, then 1 tab twice a day for 3 days, then 2 tabs twice a day and continue  3. Continue gabapentin 300mg  three times a day for another 3 days, then reduce to twice a day when you increase the Trileptal to 1 tab BID, then stop gabapentin when you increase Trileptal to 2 tabs BID  4. Follow-up in 3 months, call for any changes  Seizure Precautions: 1. If medication has been prescribed for you to prevent seizures, take it exactly as directed.  Do not stop taking the medicine without talking to your doctor first, even if you have not had a seizure in a long time.   2. Avoid activities in which a seizure would cause danger to yourself or to others.  Don't operate dangerous machinery, swim alone, or climb in high or dangerous places, such as on ladders, roofs, or girders.  Do not drive unless your doctor says you may.  3. If you have any warning that you may have a seizure, lay down in a safe place where you can't hurt yourself.    4.  No driving for 6 months from last seizure, as per [facility].   Please refer to the following link on the Epilepsy Foundation of America's website for more information: http://www.epilepsyfoundation.org/answerplace/Social/driving/drivingu.cfm   5.  Maintain good sleep hygiene. Avoid alcohol.  6.  Contact your doctor if you have any problems that may be related to the medicine you are taking.  7.  Call 911 and bring the patient back to the ED if:        A.  The seizure lasts longer than 5 minutes.       B.  The patient doesn't awaken shortly after the seizure  C.  The patient has new problems such as difficulty seeing, speaking or moving  D.  The patient was injured during the seizure  E.  The patient has a temperature over 102 F (39C)  F.  The patient vomited and now is having trouble breathing

## 2020-02-27 ENCOUNTER — Ambulatory Visit (INDEPENDENT_AMBULATORY_CARE_PROVIDER_SITE_OTHER): Payer: BC Managed Care – PPO | Admitting: Neurology

## 2020-02-27 ENCOUNTER — Other Ambulatory Visit: Payer: Self-pay

## 2020-02-27 DIAGNOSIS — R569 Unspecified convulsions: Secondary | ICD-10-CM | POA: Diagnosis not present

## 2020-03-03 ENCOUNTER — Telehealth: Payer: Self-pay | Admitting: Neurology

## 2020-03-03 NOTE — Telephone Encounter (Signed)
Patient called and left a message requesting a call back for EEG results.

## 2020-03-03 NOTE — Telephone Encounter (Signed)
I do not see results, do you have them?

## 2020-03-04 NOTE — Telephone Encounter (Signed)
Pt called informed of the EEG results she stated that she feels ok except right now she is itching yesterday was her 1st dose at 4 pills she is going to monitor, and call if it continues

## 2020-03-04 NOTE — Telephone Encounter (Signed)
Pls let her know the EEG is normal, however it is not like a pregnancy test that is positive or negative. How is she feeling on new medication and off gabapentin? thanks

## 2020-03-04 NOTE — Procedures (Signed)
ELECTROENCEPHALOGRAM REPORT  Date of Study: 02/27/2020  Patient's Name: Helen Lee MRN: [id number] Date of Birth: [birth year]  Referring Provider: Dr. Patrcia Dolly  Clinical History: This is a 49 year old woman with new onset episodes of right-sided tonic activity.   Medications: Oxcarbazepine, Lexapro, Norvasc  Technical Summary: A multichannel digital 1-hour EEG recording measured by the international 10-20 system with electrodes applied with paste and impedances below 5000 ohms performed in our laboratory with EKG monitoring in an awake and asleep patient.  Hyperventilation was not performed. Photic stimulation was performed.  The digital EEG was referentially recorded, reformatted, and digitally filtered in a variety of bipolar and referential montages for optimal display.    Description: The patient is awake and asleep during the recording.  During maximal wakefulness, there is a symmetric, medium voltage 10 Hz posterior dominant rhythm that attenuates with eye opening.  The record is symmetric.  During drowsiness and sleep, there is an increase in theta slowing of the background.  Vertex waves and symmetric sleep spindles were seen.  Photic stimulation did not elicit any abnormalities.  There were no epileptiform discharges or electrographic seizures seen.    EKG lead was unremarkable.  Impression: This 1-hour awake and asleep EEG is normal.    Clinical Correlation: A normal EEG does not exclude a clinical diagnosis of epilepsy.  If further clinical questions remain, prolonged EEG may be helpful.  Clinical correlation is advised.   Patrcia Dolly, M.D.

## 2020-03-11 ENCOUNTER — Ambulatory Visit: Payer: BC Managed Care – PPO | Admitting: Neurology

## 2020-03-25 ENCOUNTER — Telehealth: Payer: Self-pay | Admitting: Neurology

## 2020-03-25 NOTE — Telephone Encounter (Signed)
Left message with the after hour service on 03-25-20 @ 12:12 pm   Caller wants to speak with Nurse about driving please call

## 2020-03-25 NOTE — Telephone Encounter (Signed)
Left message with the after hour service on 03-25-20 @ 7:21 am    Caller states that she needs to speak to Dr Karel Jarvis nurse to discuss her getting to drive her car again   Please call

## 2020-03-25 NOTE — Telephone Encounter (Signed)
Pt called back no answer voice mail left for pt to call back  

## 2020-03-25 NOTE — Telephone Encounter (Signed)
Pt called back she is asking if she can drive her self to work? She stated that she has not had any seizures. She stated that her boy friend is giving her a hard time about having to dive her places. She would like for Dr Karel Jarvis to call her instead of the nurse to let her know if she can drive before the 6 months is up or not,

## 2020-03-26 NOTE — Telephone Encounter (Signed)
Spoke to patient. She has not had any episodes where she lost awareness or consciousness. The last episode affecting her right side was prior to initiation of oxcarbazepine last month. She has not had any further spells, tolerating oxcarbazepine better than the gabapentin. She has stopped gabapentin. Discussed [place] driving laws that if she loses consciousness or awareness, she should not drive. Ok to return to driving at this time, understanding that for any change in symptoms, would stop driving. She expressed understanding.

## 2020-06-06 ENCOUNTER — Ambulatory Visit (INDEPENDENT_AMBULATORY_CARE_PROVIDER_SITE_OTHER): Payer: BC Managed Care – PPO | Admitting: Neurology

## 2020-06-06 ENCOUNTER — Other Ambulatory Visit: Payer: Self-pay

## 2020-06-06 ENCOUNTER — Encounter: Payer: Self-pay | Admitting: Neurology

## 2020-06-06 VITALS — BP 141/92 | HR 103 | Ht 60.0 in | Wt 173.0 lb

## 2020-06-06 DIAGNOSIS — R569 Unspecified convulsions: Secondary | ICD-10-CM | POA: Diagnosis not present

## 2020-06-06 MED ORDER — OXCARBAZEPINE 600 MG PO TABS: 600.0000 mg | ORAL_TABLET | Freq: Two times a day (BID) | ORAL | 3 refills | Status: DC

## 2020-06-06 NOTE — Progress Notes (Signed)
NEUROLOGY FOLLOW UP OFFICE NOTE  Helen Lee [id number] 05-29-1971  HISTORY OF PRESENT ILLNESS: I had the pleasure of seeing Helen Lee in follow-up in the neurology clinic on 06/06/2020.  The patient was last seen 3 months ago for recurrent episodes of right-sided stiffening concerning for focal seizures. MRI brain unremarkable. Her 1-hour wake and sleep EEG is normal. She was switched from gabapentin to oxcarbazepine 600mg  BID and denies any further focal seizures since 02/2020. She denies any staring/unresponsive episodes, gaps in time, olfactory/gustatory hallucinations, myoclonic jerks. She has constant numbness and cold in both hands, R>L, worse in the tips of her fingers. They feel better when wearing gloves. She denies any headaches, dizziness, diplopia, no falls. Sleep is good. She has been driving and back to work.   History on Initial Assessment 02/25/2020: This is a pleasant 49 year old right-handed woman with a history of hypertension, anxiety, presenting for evaluation of recurrent episodes of right-sided stiffening. The first episode occurred 01/27/20 while she was at home, she noticed she could not lift her right arm, it was "just there." Symptoms lasted a few minutes then she was back to baseline. Around 5 hours later, she was walking to the bedroom when her right leg "was just stuck," she could not move it, she did not fall. The episode lasted a few minutes then she was again back to baseline. There was no associated headache, dizziness, paresthesias. Face was unaffected. She had a head CT without contrast on 4/20 which was unremarkable. Carotid dopplers did not show any significant stenosis. She had another cluster of right-sided symptoms on 02/19/20. She was getting ready for work early in the morning when she went to pick up her computer mouse and her right hand locked around the mouse. She was unable to move her hand, then symptoms started traveling down her right leg, extending out  with her toes curled under. She felt like she was going to fall but did not. The episode lasted 5 minutes, her husband told EMS that the right side of her face twitched up a little as well. She was brought to [facility] and had another brief episode in the waiting room, she felt this one coming, there was a sensation in her right hand, then her right leg/foot locked up with leg extended for a few minutes. 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 She was discharged home on gabapentin 300mg  TID, at home that evening, she had another episode at 1am that woke her up from sleep. None since then. She is having side effects of drowsiness, dizziness, imbalance on the gabapentin. She denies any staring/unresponsive episodes, gaps in time, olfactory/gustatory hallucinations, deja vu, rising epigastric sensation, focal numbness/tingling/weakness, myoclonic jerks. She has had bilateral hand numbness for a year. She has noticed her memory has been off the past 3 weeks, she is not sharp as she used to be. She had a [facility] recently and felt so embarrassed because she knew what she was going to say but could not get her words out for a few minutes.  She has had pain below her left scapula for the past couple of weeks, with pain radiating under her left breast. She cannot lay on her left side, feeling a sharp pain radiating "straight to the heart."  She denies any heaving lifting or falls, she mostly carries her 10-lb work bag with her right arm. She denies any neck or low back pain. She denies any headaches, diplopia, dysarthria/dysphagia, bowel/bladder dysfunction.   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  There is no history of febrile convulsions, CNS infections such as meningitis/encephalitis, significant traumatic brain injury, neurosurgical procedures. Her sister has alcohol withdrawal seizures.   PAST MEDICAL HISTORY: Past Medical History:  Diagnosis Date  . HSV-1 (herpes simplex virus 1) infection   . Hypertension   . Premature birth     MEDICATIONS: Current Outpatient Medications on File Prior to Visit  Medication Sig Dispense Refill  . amLODipine (NORVASC) 10 MG tablet Take 10 mg by mouth daily.    . ergocalciferol (VITAMIN D2) 1.25 MG (50000 UT) capsule Take 50,000 Units by mouth once a week.    . hydrochlorothiazide (HYDRODIURIL) 25 MG tablet Take 25 mg by mouth daily.   0  . medroxyPROGESTERone Acetate 150 MG/ML SUSY Inject 1 mL into the muscle every 3 (three) months.    . Oxcarbazepine (TRILEPTAL) 300 MG tablet Take 1/2 tab twice a day for 3 days, then 1 tab twice a day for 3 days, then 2 tabs twice a day and continue 120 tablet 6  . escitalopram (LEXAPRO) 10 MG tablet Take 10 mg by mouth daily. (Patient not taking: Reported on 06/06/2020)    . gabapentin (NEURONTIN) 300 MG capsule Take 1 capsule (300 mg total) by mouth 3 (three) times daily. (Patient not taking: Reported on 06/06/2020) 90 capsule 0   No current facility-administered medications on file prior to visit.    ALLERGIES: No Known Allergies  FAMILY HISTORY: Family History  Problem Relation Age of Onset  . Heart attack Mother   . Hypertension Other   . Diabetes Other   . Heart attack Other     SOCIAL HISTORY: Social History   Socioeconomic History  . Marital status: Single    Spouse name: Not on file  . Number of children: Not on file  . Years of education: Not on file  . Highest education level: Not on file  Occupational History  . Not on file  Tobacco Use  . Smoking status: Never Smoker  . Smokeless tobacco: Never Used  Vaping Use  . Vaping Use: Never used   Substance and Sexual Activity  . Alcohol use: Not Currently    Comment: occ  . Drug use: No  . Sexual activity: Not on file  Other Topics Concern  . Not on file  Social History Narrative   Right handed   Mobile home   Drinks caffeine    Social Determinants of Health   Financial Resource Strain:   . Difficulty of Paying Living Expenses: Not on file  Food Insecurity:   . Worried About Programme researcher, broadcasting/film/video in the Last Year: Not on file  . Ran Out of Food in the Last Year: Not on file  Transportation Needs:   . Lack of Transportation (Medical): Not on file  . Lack of Transportation (Non-Medical): Not on file  Physical Activity:   . Days of Exercise per Week: Not on file  . Minutes of Exercise per Session: Not on file  Stress:   . Feeling of Stress : Not on file  Social Connections:   . Frequency of Communication with Friends and Family: Not on file  .  Frequency of Social Gatherings with Friends and Family: Not on file  . Attends Religious Services: Not on file  . Active Member of Clubs or Organizations: Not on file  . Attends Banker Meetings: Not on file  . Marital Status: Not on file  Intimate Partner Violence:   . Fear of Current or Ex-Partner: Not on file  . Emotionally Abused: Not on file  . Physically Abused: Not on file  . Sexually Abused: Not on file    PHYSICAL EXAM: Vitals:   06/06/20 1344  BP: (!) 141/92  Pulse: (!) 103  SpO2: 98%   General: No acute distress Head:  Normocephalic/atraumatic Skin/Extremities: No rash, no edema Neurological Exam: alert and oriented to person, place, and time. No aphasia or dysarthria. Fund of knowledge is appropriate.  Recent and remote memory are intact.  Attention and concentration are normal.  Cranial nerves: Pupils equal, round. Extraocular movements intact with no nystagmus. Visual fields full. No facial asymmetry.  Motor: Bulk and tone normal, muscle strength 5/5 throughout with no pronator drift.  Finger to  nose testing intact.  Gait narrow-based and steady, able to tandem walk adequately.  Romberg negative. Negative Tinel sign at the wrist and elbow on right.   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 MRI brain no acute changes, EEG normal. She denies any further episodes since initiating oxcarbazepine 600mg  BID in May 2021. No side effects. She is off gabapentin. She has constant numbness in her hands R>L, continue to monitor, consider EMG in the future. She is aware of [place] driving laws to stop driving after an episode of loss of awareness/consciousness until 6 months seizure-free. Follow-up in 6 months, she knows to call for any changes.    Thank you for allowing me to participate in her care.  Please do not hesitate to call for any questions or concerns.   June 2021, M.D.   CC: [facility]

## 2020-06-06 NOTE — Patient Instructions (Signed)
Good to hear things are going well. A new prescription for oxcarbazepine 600mg : take 1 tablet twice a day has been sent to your pharmacy. Follow-up in 6 months,call for any changes.   Seizure Precautions: 1. If medication has been prescribed for you to prevent seizures, take it exactly as directed.  Do not stop taking the medicine without talking to your doctor first, even if you have not had a seizure in a long time.   2. Avoid activities in which a seizure would cause danger to yourself or to others.  Don't operate dangerous machinery, swim alone, or climb in high or dangerous places, such as on ladders, roofs, or girders.  Do not drive unless your doctor says you may.  3. If you have any warning that you may have a seizure, lay down in a safe place where you can't hurt yourself.    4.  No driving for 6 months from last seizure, as per [facility]   Please refer to the following link on the Epilepsy Foundation of America's website for more information: http://www.epilepsyfoundation.org/answerplace/Social/driving/drivingu.cfm   5.  Maintain good sleep hygiene. Avoid alcohol.  6.  Contact your doctor if you have any problems that may be related to the medicine you are taking.  7.  Call 911 and bring the patient back to the ED if:        A.  The seizure lasts longer than 5 minutes.       B.  The patient doesn't awaken shortly after the seizure  C.  The patient has new problems such as difficulty seeing, speaking or moving  D.  The patient was injured during the seizure  E.  The patient has a temperature over 102 F (39C)  F.  The patient vomited and now is having trouble breathing

## 2020-08-24 ENCOUNTER — Other Ambulatory Visit: Payer: Self-pay | Admitting: Neurology

## 2020-11-27 ENCOUNTER — Ambulatory Visit: Payer: BC Managed Care – PPO | Admitting: Neurology

## 2020-12-23 ENCOUNTER — Telehealth: Payer: Self-pay | Admitting: Neurology

## 2020-12-23 ENCOUNTER — Encounter: Payer: Self-pay | Admitting: Neurology

## 2020-12-23 ENCOUNTER — Ambulatory Visit: Payer: 59 | Admitting: Neurology

## 2020-12-23 NOTE — Telephone Encounter (Signed)
Pt was a no show to new pt apt today. If pt calls back to reschedule please advise if this apt is truly necessary since she is an established [place] [facility]. (the apt was for a 2nd opinion is my understanding)

## 2021-01-09 ENCOUNTER — Encounter: Payer: Self-pay | Admitting: Neurology

## 2021-01-09 ENCOUNTER — Other Ambulatory Visit: Payer: Self-pay

## 2021-01-09 ENCOUNTER — Ambulatory Visit (INDEPENDENT_AMBULATORY_CARE_PROVIDER_SITE_OTHER): Payer: 59 | Admitting: Neurology

## 2021-01-09 VITALS — BP 136/82 | HR 87 | Resp 18 | Ht 60.0 in

## 2021-01-09 DIAGNOSIS — R202 Paresthesia of skin: Secondary | ICD-10-CM

## 2021-01-09 DIAGNOSIS — R569 Unspecified convulsions: Secondary | ICD-10-CM

## 2021-01-09 NOTE — Progress Notes (Signed)
NEUROLOGY FOLLOW UP OFFICE NOTE  Helen Lee [id number] 05/19/1971  HISTORY OF PRESENT ILLNESS: I had the pleasure of seeing Helen Lee in follow-up in the neurology clinic on 01/09/2021.  The patient was last seen 7 months ago for recurrent episodes of right-sided stiffening concerning for focal seizures. MRI brain and 1-hour EEG normal. She denies any further focal seizures since 02/2020 on oxcarbazepine 600mg  BID, no side effects. She denies any staring/unresponsive episodes, gaps in time, olfactory/gustatory hallucinations, myoclonic jerks. She has numbness in both hands, like her fingers are not touching the keyboard. Wearing gloves makes hands feel better. Feet are unaffected. She has neck pain. No headaches, dizziness.   History on Initial Assessment 02/25/2020: This is a pleasant 50 year old right-handed woman with a history of hypertension, anxiety, presenting for evaluation of recurrent episodes of right-sided stiffening. The first episode occurred 01/27/20 while she was at home, she noticed she could not lift her right arm, it was "just there." Symptoms lasted a few minutes then she was back to baseline. Around 5 hours later, she was walking to the bedroom when her right leg "was just stuck," she could not move it, she did not fall. The episode lasted a few minutes then she was again back to baseline. There was no associated headache, dizziness, paresthesias. Face was unaffected. She had a head CT without contrast on 4/20 which was unremarkable. Carotid dopplers did not show any significant stenosis. She had another cluster of right-sided symptoms on 02/19/20. She was getting ready for work early in the morning when she went to pick up her computer mouse and her right hand locked around the mouse. She was unable to move her hand, then symptoms started traveling down her right leg, extending out with her toes curled under. She felt like she was going to fall but did not. The episode lasted 5  minutes, her husband told EMS that the right side of her face twitched up a little as well. She was brought to [facility] and had another brief episode in the waiting room, she felt this one coming, there was a sensation in her right hand, then her right leg/foot locked up with leg extended for a few minutes. 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 She was discharged home on gabapentin 300mg  TID, at home that evening, she had another episode at 1am that woke her up from sleep. None since then. She is having side effects of drowsiness, dizziness, imbalance on the gabapentin. She denies any staring/unresponsive episodes, gaps in time, olfactory/gustatory hallucinations, deja vu, rising epigastric sensation, focal numbness/tingling/weakness, myoclonic jerks. She has had bilateral hand numbness for a year. She has noticed her memory has been off the past 3 weeks, she is not sharp as she used to be. She had a [facility] recently and felt so embarrassed because she knew what she was going to say but could not get her words out for a few minutes.  She has had pain below her left scapula for the past couple of weeks, with pain radiating under her left breast. She cannot lay on her left side, feeling a sharp pain radiating "straight to the heart." She denies any heaving lifting or falls, she mostly carries her 10-lb work bag with her right arm. She  denies any neck or low back pain. She denies any headaches, diplopia, dysarthria/dysphagia, bowel/bladder dysfunction.   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  There is no history of febrile convulsions, CNS infections such as  meningitis/encephalitis, significant traumatic brain injury, neurosurgical procedures. Her sister has alcohol withdrawal seizures.    PAST MEDICAL HISTORY: Past Medical History:  Diagnosis Date  . HSV-1 (herpes simplex virus 1) infection   . Hypertension   . Premature birth     MEDICATIONS: Current Outpatient Medications on File Prior to Visit  Medication Sig Dispense Refill  . amLODipine (NORVASC) 10 MG tablet Take 10 mg by mouth daily.    Marland Kitchen diltiazem (DILACOR XR) 240 MG 24 hr capsule DILT-XR 240 mg capsule, extended release    . ergocalciferol (VITAMIN D2) 1.25 MG (50000 UT) capsule Take 50,000 Units by mouth once a week.    . metoprolol succinate (TOPROL-XL) 25 MG 24 hr tablet Take 25 mg by mouth daily.    Marland Kitchen oxcarbazepine (TRILEPTAL) 600 MG tablet Take 1 tablet (600 mg total) by mouth 2 (two) times daily. 180 tablet 3  . triamterene-hydrochlorothiazide (MAXZIDE-25) 37.5-25 MG tablet Take 1 tablet by mouth daily.    . hydrochlorothiazide (HYDRODIURIL) 25 MG tablet Take 25 mg by mouth daily.   0  . medroxyPROGESTERone Acetate 150 MG/ML SUSY Inject 1 mL into the muscle every 3 (three) months.     No current facility-administered medications on file prior to visit.    ALLERGIES: No Known Allergies  FAMILY HISTORY: Family History  Problem Relation Age of Onset  . Heart attack Mother   . Hypertension Other   . Diabetes Other   . Heart attack Other     SOCIAL HISTORY: Social History   Socioeconomic History  . Marital status: Single    Spouse name: Not on file  . Number of children: Not on file  . Years of education: Not on file  . Highest education level: Not on file  Occupational History  . Not on file  Tobacco Use  . Smoking status: Never Smoker  . Smokeless tobacco: Never Used  Vaping Use  . Vaping Use: Never used  Substance and Sexual Activity  . Alcohol use: Not Currently    Comment: occ  . Drug use: No  . Sexual activity: Not on file  Other Topics Concern   . Not on file  Social History Narrative   Right handed   Mobile home   Drinks caffeine    Social Determinants of Health   Financial Resource Strain: Not on file  Food Insecurity: Not on file  Transportation Needs: Not on file  Physical Activity: Not on file  Stress: Not on file  Social Connections: Not on file  Intimate Partner Violence: Not on file     PHYSICAL EXAM: Vitals:   01/09/21 1553  BP: 136/82  Pulse: 87  Resp: 18  SpO2: 99%   General: No acute distress Head:  Normocephalic/atraumatic Skin/Extremities: No rash, no edema Neurological Exam: alert and awake. No aphasia or dysarthria. Fund of knowledge is appropriate.  Recent and remote memory are intact.  Attention and concentration are normal.   Cranial nerves: Pupils equal, round. Extraocular movements intact with no nystagmus. Visual fields full.  No facial asymmetry.  Motor: Bulk and tone normal, muscle strength 5/5 throughout with no pronator drift. Sensory: intact to light touch, cold, pin, vibration sense. Finger  to nose testing intact.  Gait narrow-based and steady, able to tandem walk adequately.  Romberg negative.   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 MRI brain no acute changes, EEG normal. She denies any further episodes since initiating oxcarbazepine 600mg  BID in May 2021. She is interested in weaning off medication, we discussed risks of breakthrough seizure off medication, she would like to proceed and knows to call for any changes. She has numbness in both hands, EMG/NCV of upper extremities will be ordered to further evaluate symptoms. She is aware of [place] driving laws to stop driving after an episode of loss of consciousness until 6 months event-free. Follow-up in 6 months, call for any changes.   Thank you for allowing me to participate in her care.   Please do not hesitate to call for any questions or concerns.   June 2021, M.D.   CC: [facility]

## 2021-01-09 NOTE — Patient Instructions (Signed)
1. Start weaning off oxcarbazepine 600mg :  take 1/2 tablet in AM, 1 tablet in PM for a week,  then reduce to 1/2 tablet twice a day for a week,  then reduce to 1/2 tablet every night for a week, then stop  2. Schedule EMG/NCV of both arms  3. Follow-up in 6 months, call for any changes   Seizure Precautions: 1. If medication has been prescribed for you to prevent seizures, take it exactly as directed.  Do not stop taking the medicine without talking to your doctor first, even if you have not had a seizure in a long time.   2. Avoid activities in which a seizure would cause danger to yourself or to others.  Don't operate dangerous machinery, swim alone, or climb in high or dangerous places, such as on ladders, roofs, or girders.  Do not drive unless your doctor says you may.  3. If you have any warning that you may have a seizure, lay down in a safe place where you can't hurt yourself.    4.  No driving for 6 months from last seizure, as per [facility].   Please refer to the following link on the Epilepsy Foundation of America's website for more information: http://www.epilepsyfoundation.org/answerplace/Social/driving/drivingu.cfm   5.  Maintain good sleep hygiene. Avoid alcohol.  6.  Contact your doctor if you have any problems that may be related to the medicine you are taking.  7.  Call 911 and bring the patient back to the ED if:        A.  The seizure lasts longer than 5 minutes.       B.  The patient doesn't awaken shortly after the seizure  C.  The patient has new problems such as difficulty seeing, speaking or moving  D.  The patient was injured during the seizure  E.  The patient has a temperature over 102 F (39C)  F.  The patient vomited and now is having trouble breathing

## 2021-03-18 ENCOUNTER — Encounter: Payer: 59 | Admitting: Neurology

## 2021-07-13 ENCOUNTER — Ambulatory Visit: Payer: 59 | Admitting: Neurology

## 2021-12-04 IMAGING — MR MR HEAD WO/W CM
8 of 12 series · 36 of 48 positions shown · IV contrast (Yes)
Comparison: None.

CLINICAL DATA: TIA. Right-sided numbness.

EXAM:
MRI HEAD WITHOUT AND WITH CONTRAST
TECHNIQUE: Multiplanar, multiecho pulse sequences of the brain and surrounding
structures were obtained without and with intravenous contrast.
CONTRAST:  7.4mL GADAVIST GADOBUTROL 1 MMOL/ML IV SOLN

[Series 3: DWI · axial · 3.0mm · 1.09mm/px · z∈[-113,+13]mm · 9 of 90 slices shown (1 of 4)]
[im 1/90]
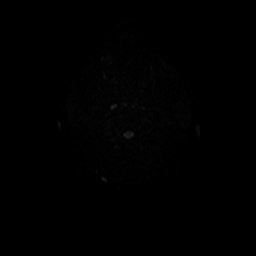
[im 12/90]
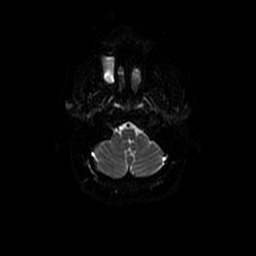
[im 23/90]
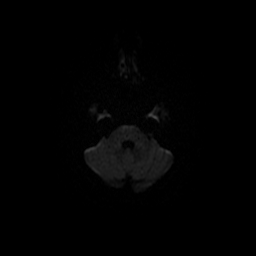
[im 34/90]
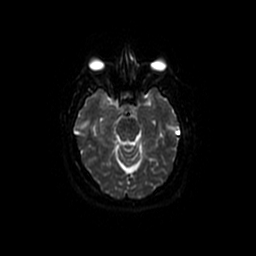
[im 45/90]
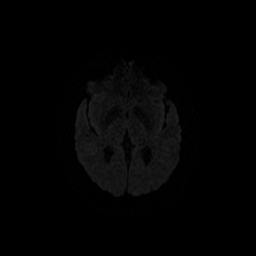
[im 56/90]
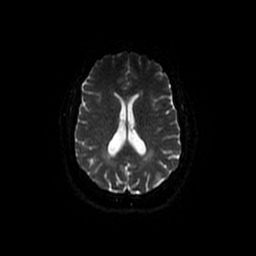
[im 67/90]
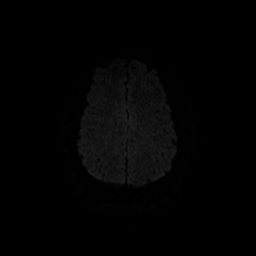
[im 78/90]
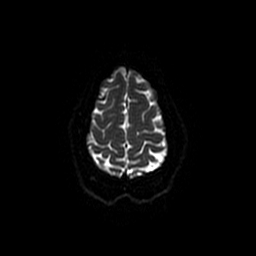
[im 90/90]
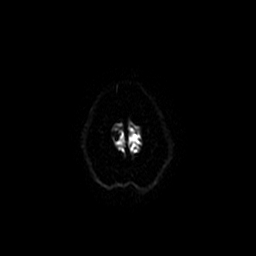

[Series 4: DWI · coronal · 5.0mm · 1.09mm/px · 6 of 66 slices shown (2 of 4)]
[im 1/66]
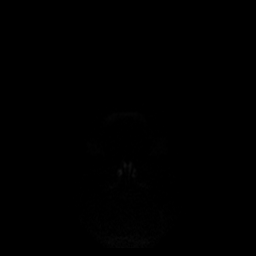
[im 14/66]
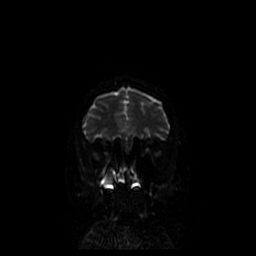
[im 27/66]
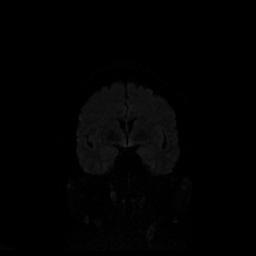
[im 40/66]
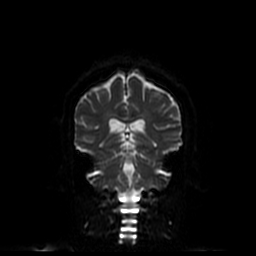
[im 53/66]
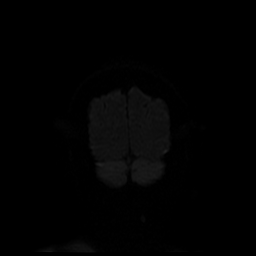
[im 66/66]
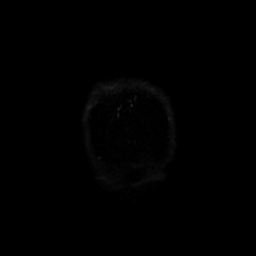

[Series 9: FLAIR · axial · 3.0mm · 0.43mm/px · z∈[-100,+34]mm · 3 of 24 slices shown (1 of 2)]
[im 1/24]
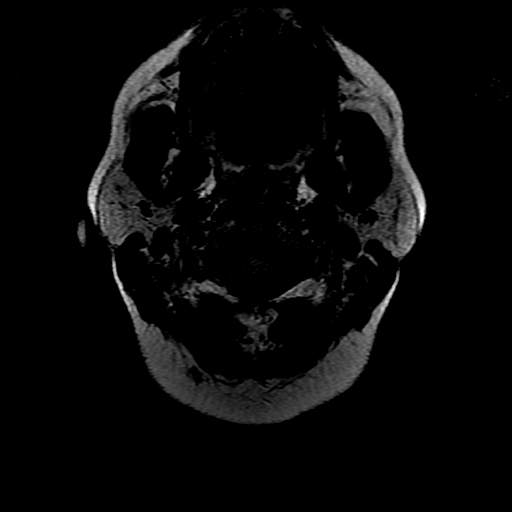
[im 12/24]
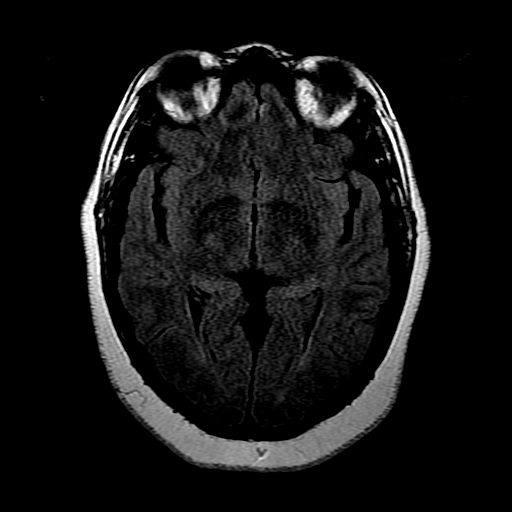
[im 24/24]
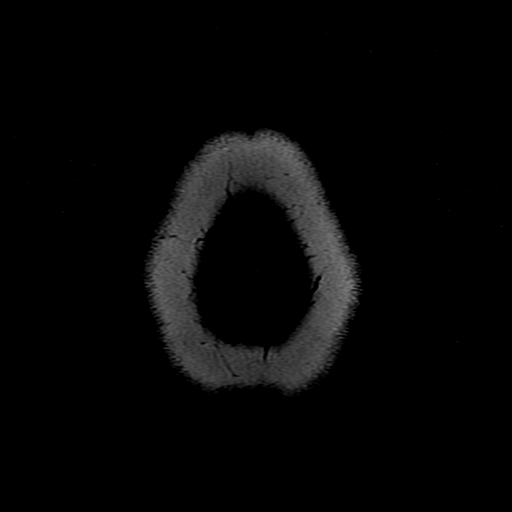

[Series 11: FLAIR · axial · 5.0mm · 0.43mm/px · z∈[-100,+34]mm · 3 of 24 slices shown (2 of 2)]
[im 1/24]
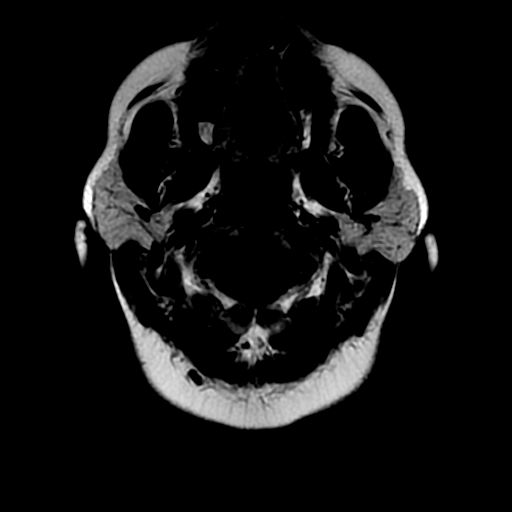
[im 12/24]
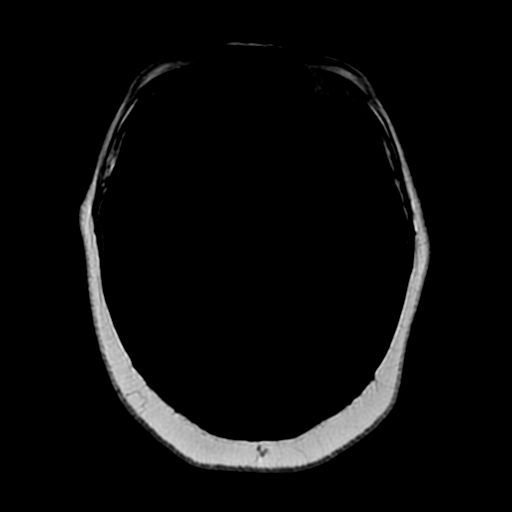
[im 24/24]
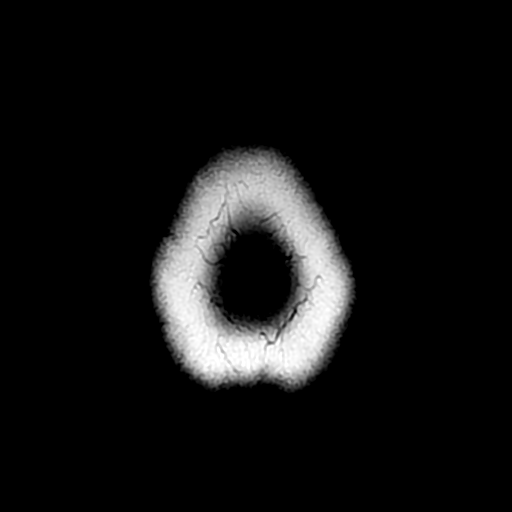

[Series 13: FLAIR post-contrast · axial · 5.0mm · 0.43mm/px · z∈[-100,+34]mm · 3 of 24 slices shown]
[im 1/24]
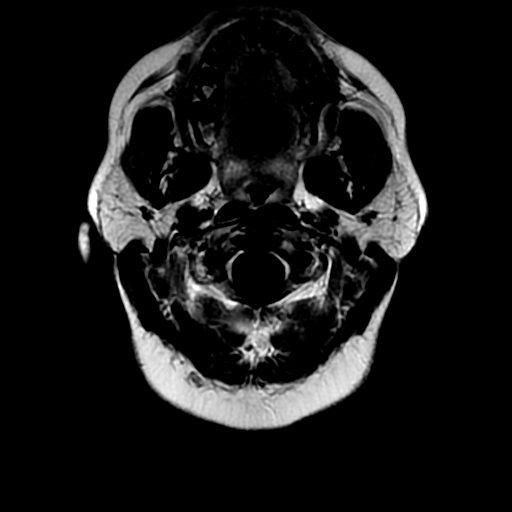
[im 12/24]
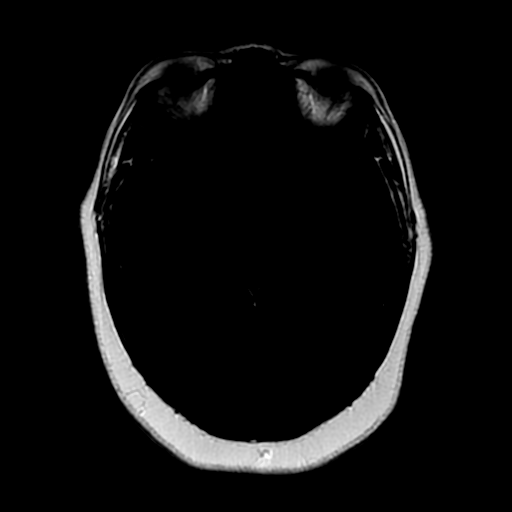
[im 24/24]
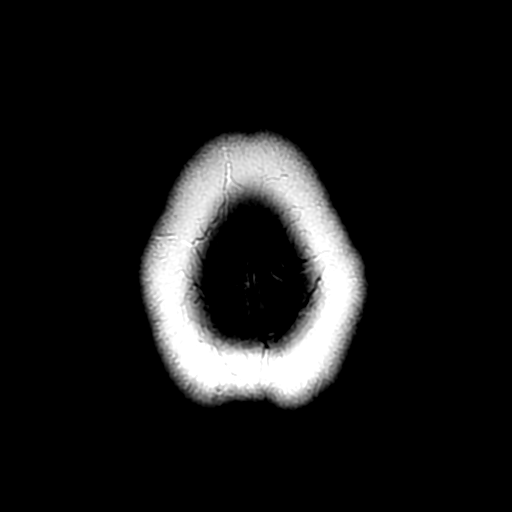

[Series 14: T1 post-contrast · coronal · 5.0mm · 0.39mm/px · 3 of 25 slices shown]
[im 1/25]
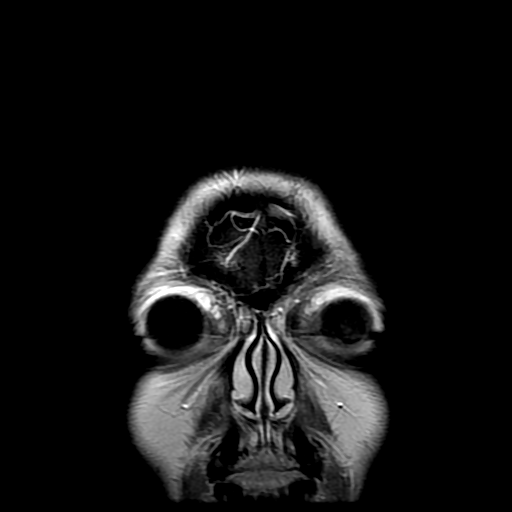
[im 13/25]
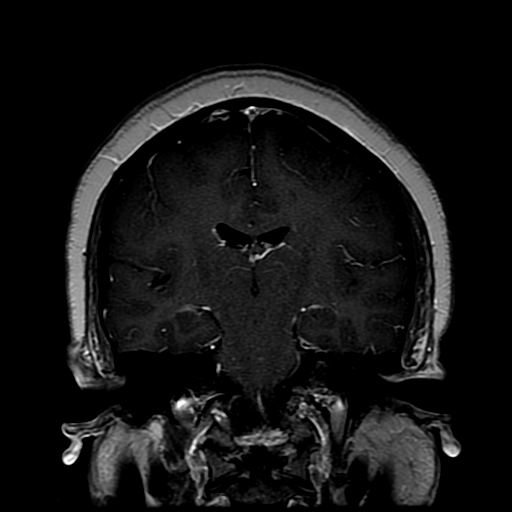
[im 25/25]
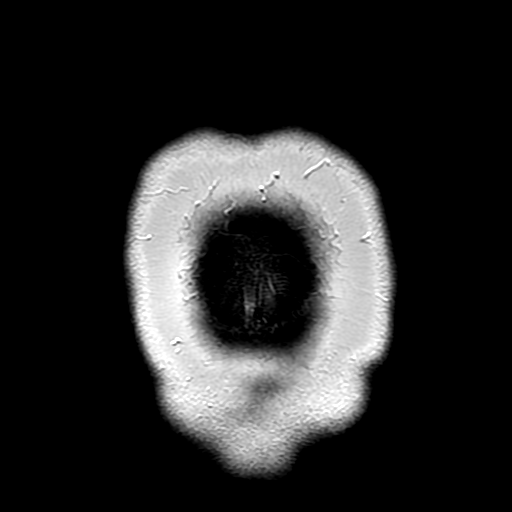

[Series 300: DWI · axial · 3.0mm · 1.09mm/px · z∈[-113,+13]mm · 5 of 45 slices shown (3 of 4)]
[im 1/45]
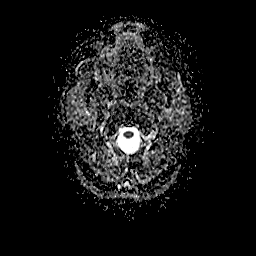
[im 12/45]
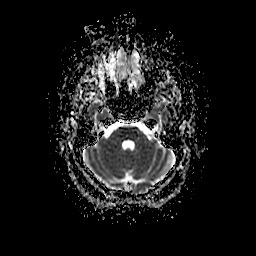
[im 23/45]
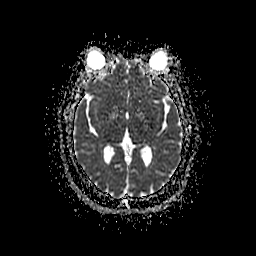
[im 34/45]
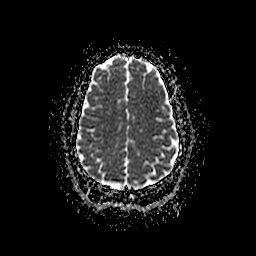
[im 45/45]
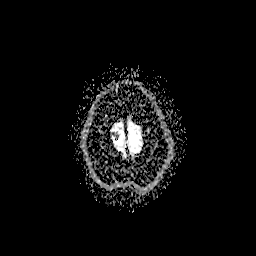

[Series 400: DWI · coronal · 5.0mm · 1.09mm/px · 4 of 33 slices shown (4 of 4)]
[im 1/33]
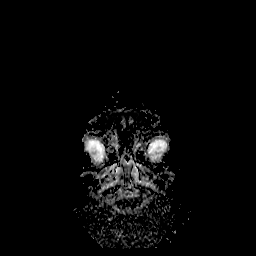
[im 11/33]
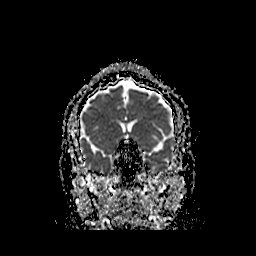
[im 22/33]
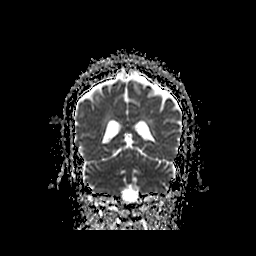
[im 33/33]
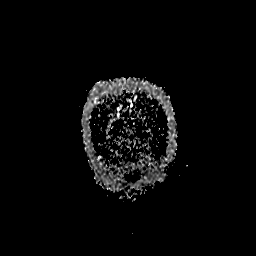

[36 of 48 positions shown; findings below may reference images not displayed]

FINDINGS: Brain: No acute infarction, hemorrhage, hydrocephalus, extra-axial
collection or mass lesion.

Patchy T2 hyperintensity of the periventricular white matter with
associated squared appearance of the atrium of the lateral
ventricles. A few other foci of T2 hyperintensity are seen within
the left corona radiata and centrum semiovale, nonspecific.

Vascular: Normal flow voids.

Skull and upper cervical spine: Normal marrow signal.

Sinuses/Orbits: Mucosal thickening of the right maxillary sinus. The
orbits are maintained.

Other: None.
IMPRESSION: 1. No acute intracranial abnormality.
2. Patchy T2 hyperintensity of the white matter, predominantly
periventricular, with associated squared appearance of the atrium of
the lateral ventricles. Findings are more consistent with sequela
from periventricular leukomalacia.

These results were called by telephone at the time of interpretation
on 02/19/2020 at [DATE] to provider STEFFAUN NELMS , who verbally
acknowledged these results.
